# Patient Record
Sex: Female | Born: 1950 | Race: White | Hispanic: No | Marital: Married | Smoking: Former smoker
Health system: Southern US, Community
[De-identification: ages and names within clinical notes are randomized; demographics above are authoritative.]

## PROBLEM LIST (undated history)

## (undated) DIAGNOSIS — R51 Headache: Secondary | ICD-10-CM

## (undated) DIAGNOSIS — F329 Major depressive disorder, single episode, unspecified: Secondary | ICD-10-CM

## (undated) DIAGNOSIS — I209 Angina pectoris, unspecified: Secondary | ICD-10-CM

## (undated) DIAGNOSIS — K219 Gastro-esophageal reflux disease without esophagitis: Secondary | ICD-10-CM

## (undated) DIAGNOSIS — F32A Depression, unspecified: Secondary | ICD-10-CM

## (undated) DIAGNOSIS — I219 Acute myocardial infarction, unspecified: Secondary | ICD-10-CM

## (undated) DIAGNOSIS — I10 Essential (primary) hypertension: Secondary | ICD-10-CM

## (undated) DIAGNOSIS — R7989 Other specified abnormal findings of blood chemistry: Secondary | ICD-10-CM

## (undated) DIAGNOSIS — R519 Headache, unspecified: Secondary | ICD-10-CM

## (undated) DIAGNOSIS — I2510 Atherosclerotic heart disease of native coronary artery without angina pectoris: Secondary | ICD-10-CM

## (undated) HISTORY — PX: CATARACT EXTRACTION W/ INTRAOCULAR LENS  IMPLANT, BILATERAL: SHX1307

## (undated) HISTORY — PX: BILATERAL CARPAL TUNNEL RELEASE: SHX6508

## (undated) HISTORY — PX: TUBAL LIGATION: SHX77

## (undated) HISTORY — PX: TONSILLECTOMY: SUR1361

## (undated) HISTORY — PX: CARDIAC CATHETERIZATION: SHX172

## (undated) HISTORY — PX: CHOLECYSTECTOMY: SHX55

## (undated) HISTORY — PX: ABDOMINAL HYSTERECTOMY: SHX81

---

## 2016-01-08 ENCOUNTER — Other Ambulatory Visit: Payer: Self-pay | Admitting: Neurological Surgery

## 2016-01-28 ENCOUNTER — Encounter (HOSPITAL_COMMUNITY): Payer: Self-pay | Admitting: *Deleted

## 2016-01-28 ENCOUNTER — Encounter (HOSPITAL_COMMUNITY): Payer: Self-pay | Admitting: Vascular Surgery

## 2016-01-28 ENCOUNTER — Encounter (HOSPITAL_COMMUNITY)
Admission: RE | Admit: 2016-01-28 | Discharge: 2016-01-28 | Disposition: A | Discharge status: Home / Self Care | Payer: Medicare Other | Source: Ambulatory Visit | Attending: Neurological Surgery | Admitting: Neurological Surgery

## 2016-01-28 DIAGNOSIS — Z01812 Encounter for preprocedural laboratory examination: Secondary | ICD-10-CM | POA: Diagnosis not present

## 2016-01-28 DIAGNOSIS — I10 Essential (primary) hypertension: Secondary | ICD-10-CM | POA: Diagnosis not present

## 2016-01-28 DIAGNOSIS — Z01810 Encounter for preprocedural cardiovascular examination: Secondary | ICD-10-CM | POA: Diagnosis present

## 2016-01-28 HISTORY — DX: Headache: R51

## 2016-01-28 HISTORY — DX: Other specified abnormal findings of blood chemistry: R79.89

## 2016-01-28 HISTORY — DX: Gastro-esophageal reflux disease without esophagitis: K21.9

## 2016-01-28 HISTORY — DX: Acute myocardial infarction, unspecified: I21.9

## 2016-01-28 HISTORY — DX: Depression, unspecified: F32.A

## 2016-01-28 HISTORY — DX: Headache, unspecified: R51.9

## 2016-01-28 HISTORY — DX: Essential (primary) hypertension: I10

## 2016-01-28 HISTORY — DX: Angina pectoris, unspecified: I20.9

## 2016-01-28 HISTORY — DX: Major depressive disorder, single episode, unspecified: F32.9

## 2016-01-28 LAB — CBC
HCT: 37.4 % (ref 36.0–46.0)
Hemoglobin: 12.1 g/dL (ref 12.0–15.0)
MCH: 27.7 pg (ref 26.0–34.0)
MCHC: 32.4 g/dL (ref 30.0–36.0)
MCV: 85.6 fL (ref 78.0–100.0)
PLATELETS: 192 10*3/uL (ref 150–400)
RBC: 4.37 MIL/uL (ref 3.87–5.11)
RDW: 14.6 % (ref 11.5–15.5)
WBC: 8.1 10*3/uL (ref 4.0–10.5)

## 2016-01-28 LAB — ABO/RH: ABO/RH(D): A POS

## 2016-01-28 LAB — BASIC METABOLIC PANEL
Anion gap: 6 (ref 5–15)
BUN: 10 mg/dL (ref 6–20)
CALCIUM: 9.6 mg/dL (ref 8.9–10.3)
CO2: 25 mmol/L (ref 22–32)
CREATININE: 1.22 mg/dL — AB (ref 0.44–1.00)
Chloride: 109 mmol/L (ref 101–111)
GFR calc Af Amer: 52 mL/min — ABNORMAL LOW (ref >60)
GFR, EST NON AFRICAN AMERICAN: 45 mL/min — AB (ref >60)
GLUCOSE: 96 mg/dL (ref 65–99)
POTASSIUM: 3.9 mmol/L (ref 3.5–5.1)
SODIUM: 140 mmol/L (ref 135–145)

## 2016-01-28 LAB — TYPE AND SCREEN
ABO/RH(D): A POS
ANTIBODY SCREEN: NEGATIVE

## 2016-01-28 LAB — SURGICAL PCR SCREEN
MRSA, PCR: NEGATIVE
Staphylococcus aureus: NEGATIVE

## 2016-01-28 NOTE — Pre-Procedure Instructions (Signed)
Kristina Riley  01/28/2016      [facility] - [place], [place] - [address] [place]ina [place] [address] Phone: [phone] Fax: [phone]    Your procedure is scheduled on  Thursday 02/06/16  [facility] Admitting at 530 A.M.  Call this number if you have problems the morning of surgery:  [phone]   Remember:  Do not eat food or drink liquids after midnight.  Take these medicines the morning of surgery with A SIP OF WATER   ALBUTEROL, BUPROPION (WELLBUTRIN), CARVEDILOL (COREG), CITALOPRAM(CELEXA), GABAPENTIN, ISOSORBIDE MONONITRATE (IMDUR), TOPIRAMATE (TOPAMAX)         (STOP 7 DAYS PRIOR TO SURGERY - ASPIRIN OR ASPIRIN PRODUCTS, IBUPROFEN/ ADVIL/ MOTRIN/ ALEVE, GOODY POWDERS, BC'S, HERBAL MEDICINES, COQ10)   Do not wear jewelry, make-up or nail polish.  Do not wear lotions, powders, or perfumes, or deoderant.  Do not shave 48 hours prior to surgery.  Men may shave face and neck.  Do not bring valuables to the hospital.  [facility] is not responsible for any belongings or valuables.  Contacts, dentures or bridgework may not be worn into surgery.  Leave your suitcase in the car.  After surgery it may be brought to your room.  For patients admitted to the hospital, discharge time will be determined by your treatment team.  Patients discharged the day of surgery will not be allowed to drive home.   Name and phone number of your driver:    Special instructions:  [place] - Preparing for Surgery  Before surgery, you can play an important role.  Because skin is not sterile, your skin needs to be as free of germs as possible.  You can reduce the number of germs on you skin by washing with CHG (chlorahexidine gluconate) soap before surgery.  CHG is an antiseptic cleaner which kills germs and bonds with the skin to continue killing germs even after washing.  Please DO NOT use if you have an allergy to CHG or  antibacterial soaps.  If your skin becomes reddened/irritated stop using the CHG and inform your nurse when you arrive at Short Stay.  Do not shave (including legs and underarms) for at least 48 hours prior to the first CHG shower.  You may shave your face.  Please follow these instructions carefully:   1.  Shower with CHG Soap the night before surgery and the                                morning of Surgery.  2.  If you choose to wash your hair, wash your hair first as usual with your       normal shampoo.  3.  After you shampoo, rinse your hair and body thoroughly to remove the                      Shampoo.  4.  Use CHG as you would any other liquid soap.  You can apply chg directly       to the skin and wash gently with scrungie or a clean washcloth.  5.  Apply the CHG Soap to your body ONLY FROM THE NECK DOWN.        Do not use on open wounds or open sores.  Avoid contact with your eyes,       ears, mouth and genitals (  private parts).  Wash genitals (private parts)       with your normal soap.  6.  Wash thoroughly, paying special attention to the area where your surgery        will be performed.  7.  Thoroughly rinse your body with warm water from the neck down.  8.  DO NOT shower/wash with your normal soap after using and rinsing off       the CHG Soap.  9.  Pat yourself dry with a clean towel.            10.  Wear clean pajamas.            11.  Place clean sheets on your bed the night of your first shower and do not        sleep with pets.  Day of Surgery  Do not apply any lotions/deoderants the morning of surgery.  Please wear clean clothes to the hospital/surgery center.     Please read over the following fact sheets that you were given. MRSA Information and Surgical Site Infection Prevention

## 2016-01-29 ENCOUNTER — Encounter (HOSPITAL_COMMUNITY): Payer: Self-pay | Admitting: Emergency Medicine

## 2016-01-29 NOTE — Progress Notes (Signed)
Anesthesia Chart Review:  Pt is a 65 year old female scheduled for C4-7 ACDF on 02/06/2016 with Cherrie DistanceBenjamin Ditty, MD.   - PCP is Vania ReaAlicia Sigmon, PA (notes in care everywhere) - [facility], last office visit 10/01/15 with Marney SettingWhitney Patterson, NP  PMH includes:  CAD, HTN, GERD. Former smoker. BMI 32  Medications include: albuterol, ASA, lipitor, carvedilol, plavix, dexilant, hctz, imdur. Pt to stop plavix 1 week prior to surgery.   Preoperative labs reviewed.    EKG 01/28/16: Sinus bradycardia (59 bpm). Nonspecific ST abnormality  Nuclear stress test 12/31/15: Technically adequate study. No evidence of significant rest or stress-induced ischemia. Normal systolic LV performance, EF 85%  Cardiac cath 12/12/13 [facility], [place], [place]):  - LAD with distal 50% stenosis.  - CX 30% - Distal R PLV 99% due to thrombus lesion.   - Medical management  If no changes, I anticipate pt can proceed with surgery as scheduled.   Rica Mastngela Samaiyah Howes, FNP-BC [facility] Phone: [phone] 01/29/2016 11:34 AM

## 2016-02-05 NOTE — Anesthesia Preprocedure Evaluation (Addendum)
Anesthesia Evaluation  Patient identified by MRN, date of birth, ID band Patient awake    Reviewed: Allergy & Precautions, H&P , NPO status , Patient's Chart, lab work & pertinent test results  Airway Mallampati: III  TM Distance: >3 FB Neck ROM: Full    Dental no notable dental hx. (+) Teeth Intact, Dental Advisory Given   Pulmonary neg pulmonary ROS, former smoker,    Pulmonary exam normal breath sounds clear to auscultation       Cardiovascular hypertension, Pt. on medications and Pt. on home beta blockers + angina with exertion + CAD   Rhythm:Regular Rate:Normal     Neuro/Psych  Headaches, Depression negative psych ROS   GI/Hepatic Neg liver ROS, GERD  ,  Endo/Other  negative endocrine ROS  Renal/GU negative Renal ROS  negative genitourinary   Musculoskeletal   Abdominal   Peds  Hematology negative hematology ROS (+)   Anesthesia Other Findings   Reproductive/Obstetrics negative OB ROS                           Anesthesia Physical Anesthesia Plan  ASA: III  Anesthesia Plan: General   Post-op Pain Management:    Induction: Intravenous  Airway Management Planned: Oral ETT  Additional Equipment:   Intra-op Plan:   Post-operative Plan: Extubation in OR  Informed Consent: I have reviewed the patients History and Physical, chart, labs and discussed the procedure including the risks, benefits and alternatives for the proposed anesthesia with the patient or authorized representative who has indicated his/her understanding and acceptance.   Dental advisory given  Plan Discussed with: CRNA  Anesthesia Plan Comments:         Anesthesia Quick Evaluation

## 2016-02-06 ENCOUNTER — Encounter (HOSPITAL_COMMUNITY): Payer: Self-pay | Admitting: *Deleted

## 2016-02-06 ENCOUNTER — Encounter (HOSPITAL_COMMUNITY)
Admission: RE | Disposition: A | Discharge status: Home / Self Care | Payer: Self-pay | Source: Ambulatory Visit | Attending: Neurological Surgery

## 2016-02-06 ENCOUNTER — Inpatient Hospital Stay (HOSPITAL_COMMUNITY)
Admission: RE | Admit: 2016-02-06 | Discharge: 2016-02-07 | DRG: 473 | Disposition: A | Discharge status: Home / Self Care | Payer: Medicare Other | Source: Ambulatory Visit | Attending: Neurological Surgery | Admitting: Neurological Surgery

## 2016-02-06 ENCOUNTER — Inpatient Hospital Stay (HOSPITAL_COMMUNITY): Payer: Medicare Other | Admitting: Emergency Medicine

## 2016-02-06 ENCOUNTER — Inpatient Hospital Stay (HOSPITAL_COMMUNITY): Payer: Medicare Other

## 2016-02-06 DIAGNOSIS — Z7982 Long term (current) use of aspirin: Secondary | ICD-10-CM | POA: Diagnosis not present

## 2016-02-06 DIAGNOSIS — I252 Old myocardial infarction: Secondary | ICD-10-CM | POA: Diagnosis not present

## 2016-02-06 DIAGNOSIS — Z9842 Cataract extraction status, left eye: Secondary | ICD-10-CM | POA: Diagnosis not present

## 2016-02-06 DIAGNOSIS — Z87891 Personal history of nicotine dependence: Secondary | ICD-10-CM

## 2016-02-06 DIAGNOSIS — I10 Essential (primary) hypertension: Secondary | ICD-10-CM | POA: Diagnosis present

## 2016-02-06 DIAGNOSIS — Z961 Presence of intraocular lens: Secondary | ICD-10-CM | POA: Diagnosis present

## 2016-02-06 DIAGNOSIS — I2510 Atherosclerotic heart disease of native coronary artery without angina pectoris: Secondary | ICD-10-CM | POA: Diagnosis present

## 2016-02-06 DIAGNOSIS — M4722 Other spondylosis with radiculopathy, cervical region: Principal | ICD-10-CM | POA: Diagnosis present

## 2016-02-06 DIAGNOSIS — Z9841 Cataract extraction status, right eye: Secondary | ICD-10-CM

## 2016-02-06 DIAGNOSIS — F329 Major depressive disorder, single episode, unspecified: Secondary | ICD-10-CM | POA: Diagnosis present

## 2016-02-06 DIAGNOSIS — K219 Gastro-esophageal reflux disease without esophagitis: Secondary | ICD-10-CM | POA: Diagnosis present

## 2016-02-06 DIAGNOSIS — Z79899 Other long term (current) drug therapy: Secondary | ICD-10-CM

## 2016-02-06 HISTORY — DX: Atherosclerotic heart disease of native coronary artery without angina pectoris: I25.10

## 2016-02-06 HISTORY — PX: ANTERIOR CERVICAL DECOMP/DISCECTOMY FUSION: SHX1161

## 2016-02-06 SURGERY — ANTERIOR CERVICAL DECOMPRESSION/DISCECTOMY FUSION 3 LEVELS
Anesthesia: General

## 2016-02-06 MED ORDER — PANTOPRAZOLE SODIUM 40 MG PO TBEC
40.0000 mg | DELAYED_RELEASE_TABLET | Freq: Every day | ORAL | Status: DC
  Administered 2016-02-06 – 2016-02-07 (×2): 40 mg via ORAL
  Filled 2016-02-06 (×2): qty 1

## 2016-02-06 MED ORDER — THROMBIN 5000 UNITS EX SOLR
CUTANEOUS | Status: DC | PRN
  Administered 2016-02-06: 10000 [IU] via TOPICAL

## 2016-02-06 MED ORDER — SODIUM CHLORIDE 0.9% FLUSH: 3.0000 mL | INTRAVENOUS | Status: DC | PRN

## 2016-02-06 MED ORDER — NEOSTIGMINE METHYLSULFATE 10 MG/10ML IV SOLN
INTRAVENOUS | Status: DC | PRN
  Administered 2016-02-06: 3 mg via INTRAVENOUS

## 2016-02-06 MED ORDER — CHLORHEXIDINE GLUCONATE CLOTH 2 % EX PADS: 6.0000 | MEDICATED_PAD | Freq: Once | CUTANEOUS | Status: DC

## 2016-02-06 MED ORDER — SODIUM CHLORIDE 0.9 % IV SOLN: INTRAVENOUS | Status: DC

## 2016-02-06 MED ORDER — TOPIRAMATE 25 MG PO TABS: 50.0000 mg | ORAL_TABLET | Freq: Every day | ORAL | Status: DC

## 2016-02-06 MED ORDER — GELATIN ABSORBABLE MT POWD
OROMUCOSAL | Status: DC | PRN
  Administered 2016-02-06: 09:00:00 via TOPICAL

## 2016-02-06 MED ORDER — SUMATRIPTAN SUCCINATE 50 MG PO TABS: 100.0000 mg | ORAL_TABLET | ORAL | Status: DC | PRN

## 2016-02-06 MED ORDER — 0.9 % SODIUM CHLORIDE (POUR BTL) OPTIME
TOPICAL | Status: DC | PRN
  Administered 2016-02-06: 1000 mL

## 2016-02-06 MED ORDER — CEFUROXIME AXETIL 250 MG PO TABS
250.0000 mg | ORAL_TABLET | Freq: Two times a day (BID) | ORAL | Status: DC
  Administered 2016-02-06 – 2016-02-07 (×2): 250 mg via ORAL
  Filled 2016-02-06 (×3): qty 1

## 2016-02-06 MED ORDER — MIDAZOLAM HCL 2 MG/2ML IJ SOLN
INTRAMUSCULAR | Status: AC
  Filled 2016-02-06: qty 2

## 2016-02-06 MED ORDER — CELECOXIB 200 MG PO CAPS
200.0000 mg | ORAL_CAPSULE | ORAL | Status: AC
  Administered 2016-02-06: 200 mg via ORAL
  Filled 2016-02-06: qty 1

## 2016-02-06 MED ORDER — SODIUM CHLORIDE 0.9 % IV SOLN: 250.0000 mL | INTRAVENOUS | Status: DC

## 2016-02-06 MED ORDER — PANTOPRAZOLE SODIUM 40 MG IV SOLR: 40.0000 mg | Freq: Every day | INTRAVENOUS | Status: DC

## 2016-02-06 MED ORDER — ROCURONIUM BROMIDE 100 MG/10ML IV SOLN
INTRAVENOUS | Status: DC | PRN
  Administered 2016-02-06: 10 mg via INTRAVENOUS
  Administered 2016-02-06: 50 mg via INTRAVENOUS

## 2016-02-06 MED ORDER — DEXAMETHASONE SODIUM PHOSPHATE 10 MG/ML IJ SOLN
INTRAMUSCULAR | Status: DC | PRN
  Administered 2016-02-06: 10 mg via INTRAVENOUS

## 2016-02-06 MED ORDER — PROPOFOL 10 MG/ML IV BOLUS
INTRAVENOUS | Status: AC
  Filled 2016-02-06: qty 20

## 2016-02-06 MED ORDER — FENTANYL CITRATE (PF) 100 MCG/2ML IJ SOLN
INTRAMUSCULAR | Status: AC
  Filled 2016-02-06: qty 4

## 2016-02-06 MED ORDER — ONDANSETRON HCL 4 MG/2ML IJ SOLN
INTRAMUSCULAR | Status: DC | PRN
  Administered 2016-02-06 (×2): 4 mg via INTRAVENOUS

## 2016-02-06 MED ORDER — FENTANYL CITRATE (PF) 100 MCG/2ML IJ SOLN
INTRAMUSCULAR | Status: DC | PRN
  Administered 2016-02-06 (×2): 50 ug via INTRAVENOUS
  Administered 2016-02-06: 100 ug via INTRAVENOUS
  Administered 2016-02-06 (×2): 50 ug via INTRAVENOUS

## 2016-02-06 MED ORDER — POLYETHYL GLYCOL-PROPYL GLYCOL 0.4-0.3 % OP SOLN: 1.0000 [drp] | Freq: Two times a day (BID) | OPHTHALMIC | Status: DC | PRN

## 2016-02-06 MED ORDER — DEXAMETHASONE SODIUM PHOSPHATE 4 MG/ML IJ SOLN
2.0000 mg | Freq: Three times a day (TID) | INTRAMUSCULAR | Status: AC
  Administered 2016-02-06 – 2016-02-07 (×3): 2 mg via INTRAVENOUS
  Filled 2016-02-06 (×3): qty 1

## 2016-02-06 MED ORDER — NITROGLYCERIN 0.4 MG SL SUBL: 0.4000 mg | SUBLINGUAL_TABLET | SUBLINGUAL | Status: DC | PRN

## 2016-02-06 MED ORDER — SODIUM CHLORIDE 0.9% FLUSH
3.0000 mL | Freq: Two times a day (BID) | INTRAVENOUS | Status: DC
  Administered 2016-02-06: 3 mL via INTRAVENOUS

## 2016-02-06 MED ORDER — GABAPENTIN 400 MG PO CAPS: 400.0000 mg | ORAL_CAPSULE | Freq: Three times a day (TID) | ORAL | Status: DC

## 2016-02-06 MED ORDER — BISACODYL 10 MG RE SUPP: 10.0000 mg | Freq: Every day | RECTAL | Status: DC | PRN

## 2016-02-06 MED ORDER — ASPIRIN 81 MG PO CHEW
81.0000 mg | CHEWABLE_TABLET | Freq: Every day | ORAL | Status: DC
  Administered 2016-02-07: 81 mg via ORAL
  Filled 2016-02-06: qty 1

## 2016-02-06 MED ORDER — THROMBIN 5000 UNITS EX SOLR
CUTANEOUS | Status: AC
  Filled 2016-02-06: qty 15000

## 2016-02-06 MED ORDER — GLYCOPYRROLATE 0.2 MG/ML IJ SOLN
INTRAMUSCULAR | Status: DC | PRN
  Administered 2016-02-06: 0.4 mg via INTRAVENOUS

## 2016-02-06 MED ORDER — PROPOFOL 10 MG/ML IV BOLUS
INTRAVENOUS | Status: DC | PRN
  Administered 2016-02-06: 120 mg via INTRAVENOUS

## 2016-02-06 MED ORDER — CEFAZOLIN SODIUM-DEXTROSE 2-4 GM/100ML-% IV SOLN
2.0000 g | INTRAVENOUS | Status: AC
  Administered 2016-02-06: 2 g via INTRAVENOUS
  Filled 2016-02-06: qty 100

## 2016-02-06 MED ORDER — DOCUSATE SODIUM 100 MG PO CAPS
100.0000 mg | ORAL_CAPSULE | Freq: Two times a day (BID) | ORAL | Status: DC
  Administered 2016-02-06 – 2016-02-07 (×2): 100 mg via ORAL
  Filled 2016-02-06 (×2): qty 1

## 2016-02-06 MED ORDER — FLEET ENEMA 7-19 GM/118ML RE ENEM: 1.0000 | ENEMA | Freq: Once | RECTAL | Status: DC | PRN

## 2016-02-06 MED ORDER — CELECOXIB 200 MG PO CAPS
200.0000 mg | ORAL_CAPSULE | Freq: Two times a day (BID) | ORAL | Status: DC
  Administered 2016-02-06 – 2016-02-07 (×2): 200 mg via ORAL
  Filled 2016-02-06 (×2): qty 1

## 2016-02-06 MED ORDER — HYDROCHLOROTHIAZIDE 12.5 MG PO CAPS
12.5000 mg | ORAL_CAPSULE | Freq: Every day | ORAL | Status: DC
  Administered 2016-02-06 – 2016-02-07 (×2): 12.5 mg via ORAL
  Filled 2016-02-06 (×2): qty 1

## 2016-02-06 MED ORDER — MONTELUKAST SODIUM 10 MG PO TABS
10.0000 mg | ORAL_TABLET | Freq: Every day | ORAL | Status: DC
  Administered 2016-02-06: 10 mg via ORAL
  Filled 2016-02-06: qty 1

## 2016-02-06 MED ORDER — CEFAZOLIN IN D5W 1 GM/50ML IV SOLN
1.0000 g | Freq: Three times a day (TID) | INTRAVENOUS | Status: AC
  Administered 2016-02-06 (×2): 1 g via INTRAVENOUS
  Filled 2016-02-06 (×2): qty 50

## 2016-02-06 MED ORDER — ONDANSETRON HCL 4 MG/2ML IJ SOLN: 4.0000 mg | INTRAMUSCULAR | Status: DC | PRN

## 2016-02-06 MED ORDER — BUPROPION HCL ER (XL) 300 MG PO TB24
300.0000 mg | ORAL_TABLET | Freq: Every day | ORAL | Status: DC
  Administered 2016-02-07: 300 mg via ORAL
  Filled 2016-02-06: qty 1

## 2016-02-06 MED ORDER — LIDOCAINE HCL (CARDIAC) 20 MG/ML IV SOLN
INTRAVENOUS | Status: DC | PRN
  Administered 2016-02-06: 60 mg via INTRAVENOUS

## 2016-02-06 MED ORDER — TOPIRAMATE 100 MG PO TABS
100.0000 mg | ORAL_TABLET | Freq: Every day | ORAL | Status: DC
  Administered 2016-02-06: 100 mg via ORAL
  Filled 2016-02-06: qty 1

## 2016-02-06 MED ORDER — LORATADINE 10 MG PO TABS
10.0000 mg | ORAL_TABLET | Freq: Every day | ORAL | Status: DC
  Administered 2016-02-07: 10 mg via ORAL
  Filled 2016-02-06: qty 1

## 2016-02-06 MED ORDER — EPINEPHRINE 0.3 MG/0.3ML IJ SOAJ
0.3000 mg | Freq: Every day | INTRAMUSCULAR | Status: DC | PRN
  Filled 2016-02-06: qty 0.3

## 2016-02-06 MED ORDER — OXYCODONE HCL 5 MG PO TABS: 5.0000 mg | ORAL_TABLET | ORAL | Status: DC | PRN

## 2016-02-06 MED ORDER — TIZANIDINE HCL 4 MG PO TABS
4.0000 mg | ORAL_TABLET | Freq: Two times a day (BID) | ORAL | Status: DC
  Administered 2016-02-06 – 2016-02-07 (×2): 4 mg via ORAL
  Filled 2016-02-06 (×2): qty 1

## 2016-02-06 MED ORDER — OXYCODONE HCL ER 20 MG PO T12A
20.0000 mg | EXTENDED_RELEASE_TABLET | Freq: Two times a day (BID) | ORAL | Status: DC
  Administered 2016-02-06: 20 mg via ORAL
  Filled 2016-02-06: qty 1

## 2016-02-06 MED ORDER — BUPIVACAINE HCL 0.5 % IJ SOLN
INTRAMUSCULAR | Status: DC | PRN
  Administered 2016-02-06: 5 mL

## 2016-02-06 MED ORDER — ISOSORBIDE MONONITRATE ER 60 MG PO TB24
60.0000 mg | ORAL_TABLET | Freq: Every day | ORAL | Status: DC
  Administered 2016-02-07: 60 mg via ORAL
  Filled 2016-02-06: qty 1

## 2016-02-06 MED ORDER — HEMOSTATIC AGENTS (NO CHARGE) OPTIME
TOPICAL | Status: DC | PRN
  Administered 2016-02-06: 1 via TOPICAL

## 2016-02-06 MED ORDER — PHENYLEPHRINE HCL 10 MG/ML IJ SOLN
INTRAMUSCULAR | Status: DC | PRN
  Administered 2016-02-06: 10 ug/min via INTRAVENOUS

## 2016-02-06 MED ORDER — CARVEDILOL 6.25 MG PO TABS
6.2500 mg | ORAL_TABLET | Freq: Two times a day (BID) | ORAL | Status: DC
  Administered 2016-02-06 – 2016-02-07 (×2): 6.25 mg via ORAL
  Filled 2016-02-06 (×2): qty 1

## 2016-02-06 MED ORDER — GABAPENTIN 300 MG PO CAPS
300.0000 mg | ORAL_CAPSULE | Freq: Three times a day (TID) | ORAL | Status: DC
  Administered 2016-02-06: 300 mg via ORAL
  Filled 2016-02-06 (×2): qty 1

## 2016-02-06 MED ORDER — SODIUM CHLORIDE 0.9 % IR SOLN
Status: DC | PRN
  Administered 2016-02-06: 09:00:00

## 2016-02-06 MED ORDER — MENTHOL 3 MG MT LOZG: 1.0000 | LOZENGE | OROMUCOSAL | Status: DC | PRN

## 2016-02-06 MED ORDER — LIDOCAINE-EPINEPHRINE (PF) 2 %-1:200000 IJ SOLN
INTRAMUSCULAR | Status: AC
  Filled 2016-02-06: qty 20

## 2016-02-06 MED ORDER — VITAMIN D3 25 MCG (1000 UNIT) PO TABS
2000.0000 [IU] | ORAL_TABLET | Freq: Every day | ORAL | Status: DC
  Administered 2016-02-07: 2000 [IU] via ORAL
  Filled 2016-02-06 (×2): qty 2

## 2016-02-06 MED ORDER — HYDROMORPHONE HCL 1 MG/ML IJ SOLN: 0.2500 mg | INTRAMUSCULAR | Status: DC | PRN

## 2016-02-06 MED ORDER — INSULIN ASPART 100 UNIT/ML ~~LOC~~ SOLN: 0.0000 [IU] | Freq: Three times a day (TID) | SUBCUTANEOUS | Status: DC

## 2016-02-06 MED ORDER — DIPHENHYDRAMINE HCL 25 MG PO CAPS
25.0000 mg | ORAL_CAPSULE | Freq: Four times a day (QID) | ORAL | Status: DC | PRN
  Administered 2016-02-06: 25 mg via ORAL
  Filled 2016-02-06: qty 1

## 2016-02-06 MED ORDER — METHOCARBAMOL 500 MG PO TABS
ORAL_TABLET | ORAL | Status: AC
  Filled 2016-02-06: qty 2

## 2016-02-06 MED ORDER — EPHEDRINE SULFATE 50 MG/ML IJ SOLN
INTRAMUSCULAR | Status: DC | PRN
  Administered 2016-02-06: 5 mg via INTRAVENOUS
  Administered 2016-02-06: 10 mg via INTRAVENOUS

## 2016-02-06 MED ORDER — PHENOL 1.4 % MT LIQD: 1.0000 | OROMUCOSAL | Status: DC | PRN

## 2016-02-06 MED ORDER — TOPIRAMATE 25 MG PO TABS
50.0000 mg | ORAL_TABLET | Freq: Every day | ORAL | Status: DC
  Administered 2016-02-07: 50 mg via ORAL
  Filled 2016-02-06: qty 2

## 2016-02-06 MED ORDER — ACETAMINOPHEN 500 MG PO TABS
1000.0000 mg | ORAL_TABLET | Freq: Four times a day (QID) | ORAL | Status: DC
  Administered 2016-02-06 – 2016-02-07 (×4): 1000 mg via ORAL
  Filled 2016-02-06 (×4): qty 2

## 2016-02-06 MED ORDER — FENTANYL CITRATE (PF) 100 MCG/2ML IJ SOLN
INTRAMUSCULAR | Status: AC
  Filled 2016-02-06: qty 2

## 2016-02-06 MED ORDER — FLUTICASONE PROPIONATE 50 MCG/ACT NA SUSP: 2.0000 | Freq: Every day | NASAL | Status: DC | PRN

## 2016-02-06 MED ORDER — ZOLPIDEM TARTRATE 5 MG PO TABS: 5.0000 mg | ORAL_TABLET | Freq: Every evening | ORAL | Status: DC | PRN

## 2016-02-06 MED ORDER — GABAPENTIN 300 MG PO CAPS: 300.0000 mg | ORAL_CAPSULE | Freq: Three times a day (TID) | ORAL | Status: DC

## 2016-02-06 MED ORDER — LACTATED RINGERS IV SOLN
INTRAVENOUS | Status: DC | PRN
  Administered 2016-02-06 (×2): via INTRAVENOUS

## 2016-02-06 MED ORDER — LIDOCAINE-EPINEPHRINE 2 %-1:100000 IJ SOLN
INTRAMUSCULAR | Status: DC | PRN
  Administered 2016-02-06: 5 mL via INTRADERMAL

## 2016-02-06 MED ORDER — CITALOPRAM HYDROBROMIDE 40 MG PO TABS
40.0000 mg | ORAL_TABLET | Freq: Every day | ORAL | Status: DC
  Administered 2016-02-07: 40 mg via ORAL
  Filled 2016-02-06: qty 1

## 2016-02-06 MED ORDER — ALBUMIN HUMAN 5 % IV SOLN
INTRAVENOUS | Status: DC | PRN
  Administered 2016-02-06: 10:00:00 via INTRAVENOUS

## 2016-02-06 MED ORDER — METHOCARBAMOL 750 MG PO TABS
750.0000 mg | ORAL_TABLET | Freq: Four times a day (QID) | ORAL | Status: DC
  Administered 2016-02-06 – 2016-02-07 (×4): 750 mg via ORAL
  Filled 2016-02-06 (×3): qty 1

## 2016-02-06 MED ORDER — ATORVASTATIN CALCIUM 20 MG PO TABS
40.0000 mg | ORAL_TABLET | Freq: Every day | ORAL | Status: DC
  Administered 2016-02-06: 40 mg via ORAL
  Filled 2016-02-06: qty 2

## 2016-02-06 MED ORDER — MIDAZOLAM HCL 5 MG/5ML IJ SOLN
INTRAMUSCULAR | Status: DC | PRN
  Administered 2016-02-06: 2 mg via INTRAVENOUS

## 2016-02-06 MED ORDER — SENNA 8.6 MG PO TABS
1.0000 | ORAL_TABLET | Freq: Two times a day (BID) | ORAL | Status: DC
  Administered 2016-02-06 – 2016-02-07 (×2): 8.6 mg via ORAL
  Filled 2016-02-06 (×2): qty 1

## 2016-02-06 MED ORDER — BIOTIN 1000 MCG PO TABS: 1000.0000 ug | ORAL_TABLET | Freq: Every day | ORAL | Status: DC

## 2016-02-06 MED ORDER — COQ-10 100 MG PO CAPS: 100.0000 mg | ORAL_CAPSULE | Freq: Every day | ORAL | Status: DC

## 2016-02-06 MED ORDER — EPINEPHRINE PF 1 MG/ML IJ SOLN
INTRAMUSCULAR | Status: AC
  Filled 2016-02-06: qty 1

## 2016-02-06 MED ORDER — ALBUTEROL SULFATE HFA 108 (90 BASE) MCG/ACT IN AERS: 1.0000 | INHALATION_SPRAY | Freq: Four times a day (QID) | RESPIRATORY_TRACT | Status: DC | PRN

## 2016-02-06 SURGICAL SUPPLY — 80 items
BENZOIN TINCTURE PRP APPL 2/3 (GAUZE/BANDAGES/DRESSINGS) ×3 IMPLANT
BIT DRILL NEURO 2X3.1 SFT TUCH (MISCELLANEOUS) ×1 IMPLANT
BIT DRILL POWER (BIT) ×1 IMPLANT
BLADE SURG 11 STRL SS (BLADE) ×3 IMPLANT
BLADE ULTRA TIP 2M (BLADE) IMPLANT
BONE MATRIX OSTEOCEL PRO SM (Bone Implant) ×6 IMPLANT
BUR MATCHSTICK NEURO 3.0 LAGG (BURR) ×3 IMPLANT
CAGE CERVICAL 14X16X6 7D (Cage) ×4 IMPLANT
CAGE CERVICAL 14X16X6MM 7DEG (Cage) ×2 IMPLANT
CAGE CERVICAL 14X16X7 7D (Cage) ×2 IMPLANT
CAGE CERVICAL 14X16X7MM 7DEG (Cage) ×1 IMPLANT
CANISTER SUCT 3000ML PPV (MISCELLANEOUS) ×3 IMPLANT
CARTRIDGE OIL MAESTRO DRILL (MISCELLANEOUS) ×1 IMPLANT
CHLORAPREP W/TINT 26ML (MISCELLANEOUS) ×3 IMPLANT
CLIP TI MEDIUM 6 (CLIP) ×3 IMPLANT
CLOSURE STERI-STRIP 1/2X4 (GAUZE/BANDAGES/DRESSINGS) ×1
CLSR STERI-STRIP ANTIMIC 1/2X4 (GAUZE/BANDAGES/DRESSINGS) ×2 IMPLANT
DECANTER SPIKE VIAL GLASS SM (MISCELLANEOUS) IMPLANT
DERMABOND ADVANCED (GAUZE/BANDAGES/DRESSINGS) ×2
DERMABOND ADVANCED .7 DNX12 (GAUZE/BANDAGES/DRESSINGS) ×1 IMPLANT
DIFFUSER DRILL AIR PNEUMATIC (MISCELLANEOUS) ×3 IMPLANT
DRAIN SNY WOU 7FLT (WOUND CARE) ×3 IMPLANT
DRAPE C-ARM 42X72 X-RAY (DRAPES) ×6 IMPLANT
DRAPE HALF SHEET 40X57 (DRAPES) ×3 IMPLANT
DRAPE LAPAROTOMY 100X72 PEDS (DRAPES) ×3 IMPLANT
DRAPE MICROSCOPE LEICA (MISCELLANEOUS) ×3 IMPLANT
DRAPE POUCH INSTRU U-SHP 10X18 (DRAPES) ×3 IMPLANT
DRAPE SHEET LG 3/4 BI-LAMINATE (DRAPES) ×3 IMPLANT
DRESSING OPSITE X SMALL 2X3 (GAUZE/BANDAGES/DRESSINGS) ×3 IMPLANT
DRILL BIT POWER (BIT) ×2
DRILL NEURO 2X3.1 SOFT TOUCH (MISCELLANEOUS) ×3
DRSG OPSITE POSTOP 4X6 (GAUZE/BANDAGES/DRESSINGS) ×3 IMPLANT
ELECT COATED BLADE 2.86 ST (ELECTRODE) ×3 IMPLANT
ELECT REM PT RETURN 9FT ADLT (ELECTROSURGICAL) ×3
ELECTRODE REM PT RTRN 9FT ADLT (ELECTROSURGICAL) ×1 IMPLANT
EVACUATOR 1/8 PVC DRAIN (DRAIN) IMPLANT
EVACUATOR SILICONE 100CC (DRAIN) ×3 IMPLANT
GAUZE SPONGE 4X4 12PLY STRL (GAUZE/BANDAGES/DRESSINGS) IMPLANT
GAUZE SPONGE 4X4 16PLY XRAY LF (GAUZE/BANDAGES/DRESSINGS) IMPLANT
GLOVE BIOGEL PI IND STRL 7.5 (GLOVE) ×2 IMPLANT
GLOVE BIOGEL PI INDICATOR 7.5 (GLOVE) ×4
GLOVE ECLIPSE 7.0 STRL STRAW (GLOVE) ×3 IMPLANT
GLOVE EXAM NITRILE LRG STRL (GLOVE) IMPLANT
GLOVE SS BIOGEL STRL SZ 7.5 (GLOVE) ×3 IMPLANT
GLOVE SUPERSENSE BIOGEL SZ 7.5 (GLOVE) ×6
GOWN STRL REUS W/ TWL LRG LVL3 (GOWN DISPOSABLE) ×3 IMPLANT
GOWN STRL REUS W/ TWL XL LVL3 (GOWN DISPOSABLE) IMPLANT
GOWN STRL REUS W/TWL LRG LVL3 (GOWN DISPOSABLE) ×6
GOWN STRL REUS W/TWL XL LVL3 (GOWN DISPOSABLE)
HEMOSTAT POWDER KIT SURGIFOAM (HEMOSTASIS) ×3 IMPLANT
KIT BASIN OR (CUSTOM PROCEDURE TRAY) ×3 IMPLANT
KIT ROOM TURNOVER OR (KITS) ×3 IMPLANT
NEEDLE HYPO 21X1.5 SAFETY (NEEDLE) ×3 IMPLANT
NEEDLE SPNL 22GX3.5 QUINCKE BK (NEEDLE) ×3 IMPLANT
NS IRRIG 1000ML POUR BTL (IV SOLUTION) ×3 IMPLANT
OIL CARTRIDGE MAESTRO DRILL (MISCELLANEOUS) ×3
PACK LAMINECTOMY NEURO (CUSTOM PROCEDURE TRAY) ×3 IMPLANT
PAD ARMBOARD 7.5X6 YLW CONV (MISCELLANEOUS) ×6 IMPLANT
PATTIES SURGICAL .5X1.5 (GAUZE/BANDAGES/DRESSINGS) IMPLANT
PLATE ARCHON 3-LEVEL 54MM (Plate) ×3 IMPLANT
RUBBERBAND STERILE (MISCELLANEOUS) ×6 IMPLANT
SCREW ARCHON ST FIXED 4X17 (Screw) ×6 IMPLANT
SCREW ARCHON ST VAR 4.0X17MM (Screw) ×18 IMPLANT
SPONGE GAUZE 4X4 12PLY STER LF (GAUZE/BANDAGES/DRESSINGS) ×3 IMPLANT
SPONGE INTESTINAL PEANUT (DISPOSABLE) ×6 IMPLANT
SPONGE SURGIFOAM ABS GEL SZ50 (HEMOSTASIS) ×3 IMPLANT
STAPLER VISISTAT 35W (STAPLE) ×3 IMPLANT
STOCKINETTE 6  STRL (DRAPES) ×2
STOCKINETTE 6 STRL (DRAPES) ×1 IMPLANT
SUT SILK 3 0 SH 30 (SUTURE) ×3 IMPLANT
SUT SILK 3 0 TIES 10X30 (SUTURE) ×3 IMPLANT
SUT STRATAFIX MNCRL+ 3-0 PS-2 (SUTURE) ×1
SUT STRATAFIX MONOCRYL 3-0 (SUTURE) ×2
SUT VIC AB 3-0 SH 8-18 (SUTURE) ×3 IMPLANT
SUTURE STRATFX MNCRL+ 3-0 PS-2 (SUTURE) ×1 IMPLANT
TOWEL OR 17X24 6PK STRL BLUE (TOWEL DISPOSABLE) ×6 IMPLANT
TOWEL OR 17X26 10 PK STRL BLUE (TOWEL DISPOSABLE) ×3 IMPLANT
TUBE CONNECTING 12'X1/4 (SUCTIONS)
TUBE CONNECTING 12X1/4 (SUCTIONS) IMPLANT
WATER STERILE IRR 1000ML POUR (IV SOLUTION) ×3 IMPLANT

## 2016-02-06 NOTE — Transfer of Care (Signed)
Immediate Anesthesia Transfer of Care Note  Patient: Kristina Riley  Procedure(s) Performed: Procedure(s) with comments: CERVICAL FOUR  TO CEVERICAL SEVEN ANTERIOR CERVICAL DECOMPRESSION/DISCECTOMY/FUSION (N/A) - CERVICAL FOUR  TO CEVERICAL SEVEN ANTERIOR CERVICAL DECOMPRESSION/DISCECTOMY/FUSION  Patient Location: PACU  Anesthesia Type:General  Level of Consciousness: awake, alert , oriented and patient cooperative  Airway & Oxygen Therapy: Patient Spontanous Breathing and Patient connected to nasal cannula oxygen  Post-op Assessment: Report given to RN and Post -op Vital signs reviewed and stable  Post vital signs: Reviewed and stable  Last Vitals:  Vitals:   02/06/16 0621  BP: 125/68  Pulse: 64  Resp: 16  Temp: 36.7 C    Last Pain:  Vitals:   02/06/16 0621  TempSrc: Oral      Patients Stated Pain Goal: 3 (02/06/16 0617)  Complications: No apparent anesthesia complications

## 2016-02-06 NOTE — Op Note (Signed)
02/06/2016  10:56 AM  PATIENT:  Kristina Riley  65 y.o. female  PRE-OPERATIVE DIAGNOSIS:  Cervical spondylosis with radiculopathy  POST-OPERATIVE DIAGNOSIS:  Same  PROCEDURE:  C4-7 anterior discectomy with fusion and plate fixation; use of PEEK interbody spacers  SURGEON:  Hulan SaasBenjamin J. Angeligue Bowne, MD  ASSISTANTS: Lisbeth RenshawNeelesh Nundkumar, MD  ANESTHESIA:   General  DRAINS: JP   SPECIMEN:  None  INDICATION FOR PROCEDURE: 65 year old woman with cervical spondylosis with radiculopathy which did not improve with medical management. Patient understood the risks, benefits, and alternatives and potential outcomes and wished to proceed.  PROCEDURE DETAILS: Patient was brought to the operating room placed under general endotracheal anesthesia. Patient was placed in the supine position on the operating room table. The neck was prepped with betadine and chloraprep and draped in a sterile fashion.   A transverse incision was made on the right side of the neck. Dissection was carried down thru the subcutaneous tissue and the platysma was elevated, opened vertically, and undermined with Metzenbaum scissors. Dissection was then carried out thru an avascular plane leaving the sternocleidomastoid, carotid artery, and jugular vein laterally and the trachea and esophagus medially. The ventral aspect of the vertebral column was identified and a localizing x-ray was taken. The C4-5 level was identified. The longus colli muscles were then elevated and the retractor was placed. The annulus was incised and the disc space entered. Discectomy was performed with micro-curettes and pituitary and kerrison rongeurs. I then used the high-speed drill to drill the endplates down to the level of the posterior longitudinal ligament. The operating microscope was draped and brought into the field provided additional magnification, illumination and visualization. Utilizing microsurgical technique, discectomy was continued posteriorly  thru the disc space. Posterior longitudinal ligament was opened with a nerve hook, and then removed along with disc herniation and osteophytes, decompressing the spinal canal and thecal sac. We then continued to remove osteophytic overgrowth and disc material decompressing the neural foramina and exiting nerve roots bilaterally. So by both visualization and palpation we felt we had an adequate decompression of the neural elements. We then measured the height of the intravertebral disc space and selected a Peek interbody cage packed with morselized allograft. It was then gently positioned in the intravertebral disc space and countersunk.   The superior distraction pin was then removed and bone bleeding was stopped with bone wax. The distraction pin was inserted at the next inferior level. The annulus was incised and the disc space entered. Discectomy was performed with micro-curettes and pituitary and kerrison rongeurs. I then used the high-speed drill to drill the endplates down to the level of the posterior longitudinal ligament. The operating microscope was returned to the field.  The discectomy was continued posteriorly thru the disc space. Posterior longitudinal ligament was opened with a nerve hook, and then removed along with disc herniation and osteophytes, decompressing the spinal canal and thecal sac. We then continued to remove osteophytic overgrowth and disc material decompressing the neural foramina and exiting nerve roots bilaterally. So by both visualization and palpation we felt we had an adequate decompression of the neural elements. We then measured the height of the intravertebral disc space and selected a second Peek interbody cage which was packed with morselized allograft. It was then gently positioned in the intravertebral disc space and countersunk.   The superior distraction pin was then again removed and bone bleeding was stopped with bone wax. The distraction pin was inserted at the  most inferior level.  The annulus was incised and the disc space entered. Discectomy was performed with micro-curettes and pituitary and kerrison rongeurs. I then used the high-speed drill to drill the endplates down to the level of the posterior longitudinal ligament. The operating microscope was returned to the field.  The discectomy was continued posteriorly thru the disc space. Posterior longitudinal ligament was opened with a nerve hook, and then removed along with disc herniation and osteophytes, decompressing the spinal canal and thecal sac. We then continued to remove osteophytic overgrowth and disc material decompressing the neural foramina and exiting nerve roots bilaterally. So by both visualization and palpation we felt we had an adequate decompression of the neural elements. We then measured the height of the intravertebral disc space and selected a third Peek interbody cage which was packed with morselized allograft. It was then gently positioned in the intravertebral disc space and countersunk.   I then inserted a titanium plate and placed six variable angle screws into the three superior vertebral bodies and two fixed angle screws at the inferior level and locked them into position. The wound was irrigated with bacitracin solution, checked for hemostasis which was established and confirmed. Once meticulous hemostasis was achieved a medium hemovac drain was placed. The platysma was closed with interrupted 3-0 undyed Vicryl suture, the subcuticular layer was closed with running monocryl suture. The skin edges were approximated with dermabond. The drapes were removed. A sterile dressing was applied. The patient was then awakened from general anesthesia and transferred to the recovery room in stable condition. At the end of the procedure all sponge, needle and instrument counts were correct.   PATIENT DISPOSITION:  PACU - hemodynamically stable.   Delay start of Pharmacological VTE agent (>24hrs)  due to surgical blood loss or risk of bleeding:  yes

## 2016-02-06 NOTE — Progress Notes (Signed)
No acute post op issues Awake and alert Voice normal Antigravity with upper and lower extremities Neck looks good Stable

## 2016-02-06 NOTE — H&P (Signed)
CC:  No chief complaint on file.   HPI: Kristina Riley is a 65 y.o. female with cervical radiculopathy presents for a C4-7 ACDF.  No changes since clinic.  PMH: Past Medical History:  Diagnosis Date  . Anginal pain (HCC)   . Coronary artery disease   . Creatinine elevation   . Depression   . GERD (gastroesophageal reflux disease)   . Headache   . Hypertension   . Myocardial infarction     PSH: Past Surgical History:  Procedure Laterality Date  . ABDOMINAL HYSTERECTOMY     PARTIAL   . BILATERAL CARPAL TUNNEL RELEASE    . CARDIAC CATHETERIZATION     12/2013  . CATARACT EXTRACTION W/ INTRAOCULAR LENS  IMPLANT, BILATERAL    . CHOLECYSTECTOMY    . TONSILLECTOMY    . TUBAL LIGATION      SH: Social History  Substance Use Topics  . Smoking status: Former Games developermoker  . Smokeless tobacco: Never Used  . Alcohol use No    MEDS: Prior to Admission medications   Medication Sig Start Date End Date Taking? Authorizing Provider  aspirin 81 MG chewable tablet Chew 81 mg by mouth daily.   Yes Historical Provider, MD  atorvastatin (LIPITOR) 40 MG tablet Take 40 mg by mouth at bedtime. 12/02/15  Yes Historical Provider, MD  Biotin 1000 MCG tablet Take 1,000 mcg by mouth at bedtime.   Yes Historical Provider, MD  buPROPion (WELLBUTRIN XL) 300 MG 24 hr tablet Take 300 mg by mouth daily. 11/04/15  Yes Historical Provider, MD  carvedilol (COREG) 6.25 MG tablet Take 6.25 mg by mouth 2 (two) times daily. 12/25/15  Yes Historical Provider, MD  cefUROXime (CEFTIN) 250 MG tablet Take 250 mg by mouth 2 (two) times daily with a meal. 01/29/16 02/07/16 Yes Historical Provider, MD  Cholecalciferol (VITAMIN D3) 2000 units capsule Take 2,000 Units by mouth daily.   Yes Historical Provider, MD  citalopram (CELEXA) 40 MG tablet Take 40 mg by mouth daily. 11/18/15  Yes Historical Provider, MD  clopidogrel (PLAVIX) 75 MG tablet Take 75 mg by mouth at bedtime. 12/17/15  Yes Historical Provider, MD  Coenzyme Q10  (COQ-10) 100 MG CAPS Take 100 mg by mouth daily.   Yes Historical Provider, MD  DEXILANT 60 MG capsule Take 60 mg by mouth at bedtime. 12/02/15  Yes Historical Provider, MD  fexofenadine (ALLEGRA) 180 MG tablet Take 180 mg by mouth daily.   Yes Historical Provider, MD  gabapentin (NEURONTIN) 300 MG capsule Take 300 mg by mouth 2 (two) times daily. 12/12/15  Yes Historical Provider, MD  hydrochlorothiazide (MICROZIDE) 12.5 MG capsule Take 12.5 mg by mouth daily. 12/26/15  Yes Historical Provider, MD  isosorbide mononitrate (IMDUR) 60 MG 24 hr tablet Take 60 mg by mouth daily. 10/29/15  Yes Historical Provider, MD  montelukast (SINGULAIR) 10 MG tablet Take 10 mg by mouth at bedtime. 11/03/15  Yes Historical Provider, MD  Polyethyl Glycol-Propyl Glycol (SYSTANE ULTRA) 0.4-0.3 % SOLN Place 1-2 drops into both eyes 2 (two) times daily as needed (for dry eyes). Support Advanced Lubricant Eye Drops   Yes Historical Provider, MD  rizatriptan (MAXALT) 10 MG tablet Take 10 mg by mouth daily as needed for migraine. May repeat in 2 hours if needed   Yes Historical Provider, MD  tiZANidine (ZANAFLEX) 4 MG tablet Take 4 mg by mouth 2 (two) times daily.   Yes Historical Provider, MD  topiramate (TOPAMAX) 50 MG tablet Take 50-100 mg by mouth  2 (two) times daily. 50 mg in the morning and 100 mg at night   Yes Historical Provider, MD  albuterol (PROVENTIL HFA;VENTOLIN HFA) 108 (90 Base) MCG/ACT inhaler Inhale 1-2 puffs into the lungs every 6 (six) hours as needed for wheezing or shortness of breath.    Historical Provider, MD  EPINEPHrine 0.3 mg/0.3 mL IJ SOAJ injection Inject 0.3 mg into the muscle daily as needed (allergic reaction.).    Historical Provider, MD  fluticasone (FLONASE) 50 MCG/ACT nasal spray Place 2 sprays into both nostrils daily as needed (for allergies/nasal symptoms).    Historical Provider, MD  nitroGLYCERIN (NITROSTAT) 0.4 MG SL tablet Place 0.4 mg under the tongue every 5 (five) minutes as needed for  chest pain.    Historical Provider, MD    ALLERGY: Allergies  Allergen Reactions  . Food Other (See Comments)    Seafood--"swelling" and numbness of the lips  . Gabapentin Other (See Comments)    PATIENT STATES IT MADE HER INCONTINENT OF URINE  . Percocet [Oxycodone-Acetaminophen] Itching    ROS: ROS  NEUROLOGIC EXAM: Awake, alert, oriented Memory and concentration grossly intact Speech fluent, appropriate CN grossly intact Motor exam: Upper Extremities Deltoid Bicep Tricep Grip  Right 5/5 5/5 5/5 5/5  Left 5/5 5/5 5/5 5/5   Lower Extremity IP Quad PF DF EHL  Right 5/5 5/5 5/5 5/5 5/5  Left 5/5 5/5 5/5 5/5 5/5   Sensation grossly intact to LT  IMAGING: No new imaging  IMPRESSION: - 65 y.o. female with cervical radiculopathy.  PLAN: - C4-7 ACDF - Risks, benefits, and alternatives discussed.

## 2016-02-06 NOTE — Progress Notes (Signed)
Orthopedic Tech Progress Note Patient Details:  Kristina Riley Sep 13, 1950 [id number]  Ortho Devices Type of Ortho Device: Soft collar Ortho Device/Splint Location: Soft Collar (neck) Ortho Device/Splint Interventions: Application   Kristina Riley, Kristina Riley 02/06/2016, 11:55 AM

## 2016-02-06 NOTE — Anesthesia Postprocedure Evaluation (Signed)
Anesthesia Post Note  Patient: Tyshawn J Theilen  Procedure(s) Performed: Procedure(s) (LRB): CERVICAL FOUR  TO CEVERICAL SEVEN ANTERIOR CERVICAL DECOMPRESSION/DISCECTOMY/FUSION (N/A)  Patient location during evaluation: PACU Anesthesia Type: General Level of consciousness: awake and alert Pain management: pain level controlled Vital Signs Assessment: post-procedure vital signs reviewed and stable Respiratory status: spontaneous breathing, nonlabored ventilation, respiratory function stable and patient connected to nasal cannula oxygen Cardiovascular status: blood pressure returned to baseline and stable Postop Assessment: no signs of nausea or vomiting Anesthetic complications: no    Last Vitals:  Vitals:   02/06/16 1145 02/06/16 1200  BP: 114/71 110/75  Pulse: 79 74  Resp: 15 10  Temp:      Last Pain:  Vitals:   02/06/16 1145  TempSrc:   PainSc: 3                  Ishaaq Penna,W. EDMOND

## 2016-02-07 MED ORDER — CLOPIDOGREL BISULFATE 75 MG PO TABS: 75.0000 mg | ORAL_TABLET | Freq: Every day | ORAL | 0 refills | Status: AC

## 2016-02-07 MED ORDER — GABAPENTIN 300 MG PO CAPS: 300.0000 mg | ORAL_CAPSULE | Freq: Three times a day (TID) | ORAL | 2 refills | Status: AC

## 2016-02-07 MED ORDER — METHOCARBAMOL 750 MG PO TABS: 750.0000 mg | ORAL_TABLET | Freq: Four times a day (QID) | ORAL | 2 refills | Status: AC

## 2016-02-07 MED ORDER — HYDROCODONE-ACETAMINOPHEN 7.5-325 MG PO TABS: 1.0000 | ORAL_TABLET | ORAL | 0 refills | Status: AC | PRN

## 2016-02-07 NOTE — Discharge Summary (Signed)
Date of Admission: 02/06/2016  Date of Discharge: 02/07/16  Admission Diagnosis: Cervical spondylosis with radiculopathy  Discharge Diagnosis: Same  Procedure Performed: C4-7 ACDF  Attending: Loura HaltBenjamin Jared Chari Parmenter, MD  Hospital Course:  The patient was admitted for the above listed operation and had an uncomplicated post-operative course.  They were discharged in stable condition.  Follow up: 3 weeks    Medication List    TAKE these medications   albuterol 108 (90 Base) MCG/ACT inhaler Commonly known as:  PROVENTIL HFA;VENTOLIN HFA Inhale 1-2 puffs into the lungs every 6 (six) hours as needed for wheezing or shortness of breath.   aspirin 81 MG chewable tablet Chew 81 mg by mouth daily.   atorvastatin 40 MG tablet Commonly known as:  LIPITOR Take 40 mg by mouth at bedtime.   Biotin 1000 MCG tablet Take 1,000 mcg by mouth at bedtime.   buPROPion 300 MG 24 hr tablet Commonly known as:  WELLBUTRIN XL Take 300 mg by mouth daily.   carvedilol 6.25 MG tablet Commonly known as:  COREG Take 6.25 mg by mouth 2 (two) times daily.   cefUROXime 250 MG tablet Commonly known as:  CEFTIN Take 250 mg by mouth 2 (two) times daily with a meal.   citalopram 40 MG tablet Commonly known as:  CELEXA Take 40 mg by mouth daily.   clopidogrel 75 MG tablet Commonly known as:  PLAVIX Take 1 tablet (75 mg total) by mouth at bedtime. Start taking on:  02/13/2016   CoQ-10 100 MG Caps Take 100 mg by mouth daily.   DEXILANT 60 MG capsule Generic drug:  dexlansoprazole Take 60 mg by mouth at bedtime.   EPINEPHrine 0.3 mg/0.3 mL Soaj injection Commonly known as:  EPI-PEN Inject 0.3 mg into the muscle daily as needed (allergic reaction.).   fexofenadine 180 MG tablet Commonly known as:  ALLEGRA Take 180 mg by mouth daily.   fluticasone 50 MCG/ACT nasal spray Commonly known as:  FLONASE Place 2 sprays into both nostrils daily as needed (for allergies/nasal symptoms).   gabapentin  300 MG capsule Commonly known as:  NEURONTIN Take 1 capsule (300 mg total) by mouth 3 (three) times daily. What changed:  when to take this   hydrochlorothiazide 12.5 MG capsule Commonly known as:  MICROZIDE Take 12.5 mg by mouth daily.   HYDROcodone-acetaminophen 7.5-325 MG tablet Commonly known as:  NORCO Take 1-2 tablets by mouth every 4 (four) hours as needed for moderate pain.   isosorbide mononitrate 60 MG 24 hr tablet Commonly known as:  IMDUR Take 60 mg by mouth daily.   methocarbamol 750 MG tablet Commonly known as:  ROBAXIN Take 1 tablet (750 mg total) by mouth 4 (four) times daily.   montelukast 10 MG tablet Commonly known as:  SINGULAIR Take 10 mg by mouth at bedtime.   nitroGLYCERIN 0.4 MG SL tablet Commonly known as:  NITROSTAT Place 0.4 mg under the tongue every 5 (five) minutes as needed for chest pain.   rizatriptan 10 MG tablet Commonly known as:  MAXALT Take 10 mg by mouth daily as needed for migraine. May repeat in 2 hours if needed   SYSTANE ULTRA 0.4-0.3 % Soln Generic drug:  Polyethyl Glycol-Propyl Glycol Place 1-2 drops into both eyes 2 (two) times daily as needed (for dry eyes). Support Advanced Lubricant Eye Drops   tiZANidine 4 MG tablet Commonly known as:  ZANAFLEX Take 4 mg by mouth 2 (two) times daily.   topiramate 50 MG tablet Commonly known  as:  TOPAMAX Take 50-100 mg by mouth 2 (two) times daily. 50 mg in the morning and 100 mg at night   Vitamin D3 2000 units capsule Take 2,000 Units by mouth daily.

## 2016-02-07 NOTE — Progress Notes (Signed)
No acute events Doing well Home today

## 2016-02-07 NOTE — Progress Notes (Signed)
Pt and husband given D/C instructions with Rx's, verbal understanding was provided. Pt's JP drain was removed without difficulty. Pt's incision is covered with gauze dressing and is clean and dry. Pt's IV was removed prior to D/C. Pt D/C'd home via wheelchair @ 1200 per MD order. Pt is stable @ D/C and has no other needs at this time. Rema FendtAshley Shivaun Bilello, RN

## 2016-02-07 NOTE — Clinical Social Work Note (Signed)
CSW acknowledges SNF consult. Patient discharging home today.  CSW signing off. Consult again if any social work needs arise.  Charlynn CourtSarah Dashauna Heymann, CSW [phone]

## 2016-02-07 NOTE — Evaluation (Signed)
Physical Therapy Evaluation and Discharge  Patient Details Name: Kristina Riley MRN: [id number] DOB: 1950/06/22 Today's Date: 02/07/2016   History of Present Illness  65 yo female s/p ACDF C4-7   Clinical Impression  Patient evaluated by Physical Therapy with no further acute PT needs identified. All education has been completed and the patient has no further questions. At the time of PT eval pt was able to perform transfers and ambulation with modified independence to supervision for safety. Pt with mild balance deficits at baseline, and recommending outpatient PT to address this and prevent future falls. See below for any follow-up Physical Therapy or equipment needs. PT is signing off. Thank you for this referral.     Follow Up Recommendations Outpatient PT;Supervision for mobility/OOB    Equipment Recommendations  None recommended by PT    Recommendations for Other Services       Precautions / Restrictions Precautions Precautions: Cervical Precaution Comments: cervical handout provided and required reinforcement of no pulling/ pushing Required Braces or Orthoses: Cervical Brace Cervical Brace: Soft collar Restrictions Weight Bearing Restrictions: No      Mobility  Bed Mobility Overal bed mobility: Modified Independent             General bed mobility comments: Instructed in log roll technique.  Transfers Overall transfer level: Modified independent Equipment used: None             General transfer comment: able to power up without use of BIL UE  Ambulation/Gait Ambulation/Gait assistance: Supervision Ambulation Distance (Feet): 300 Feet Assistive device: None Gait Pattern/deviations: Step-through pattern;Decreased stride length Gait velocity: Decreased Gait velocity interpretation: Below normal speed for age/gender General Gait Details: Pt reports her strength and balance have been poor for several years, however walking well overall. No large LOB noted,  however pt appears to slightly stumble if turning too quickly.   Stairs Stairs: Yes Stairs assistance: Supervision Stair Management: One rail Right;Alternating pattern;Forwards Number of Stairs: 10 General stair comments: Pt completed without assistance.   Wheelchair Mobility    Modified Rankin (Stroke Patients Only)       Balance Overall balance assessment: Needs assistance Sitting-balance support: Feet supported;No upper extremity supported Sitting balance-Leahy Scale: Good     Standing balance support: No upper extremity supported;During functional activity Standing balance-Leahy Scale: Fair                               Pertinent Vitals/Pain Pain Assessment: Faces Faces Pain Scale: Hurts a little bit Pain Location: Incision site Pain Descriptors / Indicators: Operative site guarding Pain Intervention(s): Limited activity within patient's tolerance;Monitored during session;Repositioned    Home Living Family/patient expects to be discharged to:: Private residence Living Arrangements: Spouse/significant other Available Help at Discharge: Family Type of Home: House Home Access: Stairs to enter   Secretary/administratorntrance Stairs-Number of Steps: 3 Home Layout: One level Home Equipment: Bedside commode      Prior Function Level of Independence: Independent               Hand Dominance   Dominant Hand: Right    Extremity/Trunk Assessment   Upper Extremity Assessment: LUE deficits/detail       LUE Deficits / Details: reports none at this time but states "when i have over done it last night i had some tingling"   Lower Extremity Assessment: Overall WFL for tasks assessed (Pt reports weakness for years)      Cervical / Trunk  Assessment: Other exceptions (s/p surg)  Communication   Communication: No difficulties  Cognition Arousal/Alertness: Awake/alert Behavior During Therapy: WFL for tasks assessed/performed Overall Cognitive Status: Within  Functional Limits for tasks assessed                      General Comments General comments (skin integrity, edema, etc.): dressing dry and drain still intact    Exercises     Assessment/Plan    PT Assessment Patent does not need any further PT services  PT Problem List            PT Treatment Interventions      PT Goals (Current goals can be found in the Care Plan section)  Acute Rehab PT Goals Patient Stated Goal: Home today PT Goal Formulation: All assessment and education complete, DC therapy    Frequency     Barriers to discharge        Co-evaluation               End of Session Equipment Utilized During Treatment: Gait belt;Cervical collar Activity Tolerance: Patient tolerated treatment well Patient left: with call bell/phone within reach;in bed;with family/visitor present Nurse Communication: Mobility status         Time: [id number] PT Time Calculation (min) (ACUTE ONLY): 30 min   Charges:   PT Evaluation $PT Eval Moderate Complexity: 1 Procedure PT Treatments $Gait Training: 8-22 mins   PT G Codes:        Marylynn PearsonLaura D Aylssa Herrig 02/07/2016, 10:45 AM   Conni SlipperLaura Adeana Grilliot, PT, DPT Acute Rehabilitation Services Pager: [phone]

## 2016-02-07 NOTE — Evaluation (Signed)
Occupational Therapy Evaluation Patient Details Name: Kristina Riley MRN: [id number] DOB: Dec 04, 1950 Today's Date: 02/07/2016    History of Present Illness 65 yo female s/p ACDF C4-7   Past Medical History:  Diagnosis Date  . Anginal pain (HCC)   . Coronary artery disease   . Creatinine elevation   . Depression   . GERD (gastroesophageal reflux disease)   . Headache   . Hypertension   . Myocardial infarction       Clinical Impression   Patient is s/p ACDF C4-7 surgery resulting in functional limitations due to the deficits listed below (see OT problem list). PTA was independent with all adls. Pt very concerned with being able to carry a brief case full of paperwork. Pt advised not to pick up bag or pull it at this time. Pt advised to have spouse (A) with lifting and carrying of paper work. pt frowning in response.  Patient will benefit from skilled OT acutely to increase independence and safety with ADLS to allow discharge home without follow up.     Follow Up Recommendations  No OT follow up    Equipment Recommendations  None recommended by OT    Recommendations for Other Services       Precautions / Restrictions Precautions Precautions: Cervical Precaution Comments: cervical handout provided and required reinforcement of no pulling/ pushing Required Braces or Orthoses: Cervical Brace      Mobility Bed Mobility Overal bed mobility: Modified Independent                Transfers Overall transfer level: Modified independent               General transfer comment: able to power up without use of BIL UE    Balance                                            ADL Overall ADL's : Modified independent                                       General ADL Comments: educated on precautions with adls. pt able to don doff brace using mirror. pt needs cues for pushign / pulling throughout session     Vision Vision  Assessment?: No apparent visual deficits   Perception     Praxis      Pertinent Vitals/Pain Pain Assessment: Faces Faces Pain Scale: Hurts a little bit Pain Location: soreness throat Pain Descriptors / Indicators: Sore Pain Intervention(s): Monitored during session;Repositioned;Premedicated before session;Limited activity within patient's tolerance     Hand Dominance Right   Extremity/Trunk Assessment Upper Extremity Assessment Upper Extremity Assessment: LUE deficits/detail LUE Deficits / Details: reports none at this time but states "when i have over done it last night i had some tingling"   Lower Extremity Assessment Lower Extremity Assessment: Defer to PT evaluation   Cervical / Trunk Assessment Cervical / Trunk Assessment: Other exceptions (s/p surg)   Communication Communication Communication: No difficulties   Cognition Arousal/Alertness: Awake/alert Behavior During Therapy: WFL for tasks assessed/performed Overall Cognitive Status: Within Functional Limits for tasks assessed                     General Comments       Exercises  Shoulder Instructions      Home Living Family/patient expects to be discharged to:: Private residence Living Arrangements: Spouse/significant other Available Help at Discharge: Family Type of Home: House             Bathroom Shower/Tub: Walk-in Soil scientistshower   Bathroom Toilet: Standard     Home Equipment: Bedside commode          Prior Functioning/Environment Level of Independence: Independent                 OT Problem List:     OT Treatment/Interventions:      OT Goals(Current goals can be found in the care plan section)    OT Frequency:     Barriers to D/C:            Co-evaluation              End of Session Equipment Utilized During Treatment: Cervical collar Nurse Communication: Mobility status;Precautions  Activity Tolerance: Patient tolerated treatment well Patient left: in  bed;with call bell/phone within reach;with family/visitor present   Time: 0825-0853 OT Time Calculation (min): 28 min Charges:  OT General Charges $OT Visit: 1 Procedure OT Evaluation $OT Eval Moderate Complexity: 1 Procedure OT Treatments $Self Care/Home Management : 8-22 mins G-Codes:    Kristina Riley, Kristina Riley   Kristina Riley   OTR/L Pager: [phone]: [phone] Office: [facility] .

## 2016-02-10 ENCOUNTER — Encounter (HOSPITAL_COMMUNITY): Payer: Self-pay | Admitting: Neurological Surgery

## 2017-06-19 IMAGING — RF DG CERVICAL SPINE 1V
1 series · 1 of 1 positions shown · non-contrast
Comparison: MR of the cervical spine December 19, 2015

CLINICAL DATA: C4-C7 anterior cervical decompression, discs ectomy,
and fusion. Reported fluoro time is 10 seconds.

EXAM:
CERVICAL SPINE 1 VIEW

[Series 1: run · 1 of 1 slices shown]
[im 1/1]
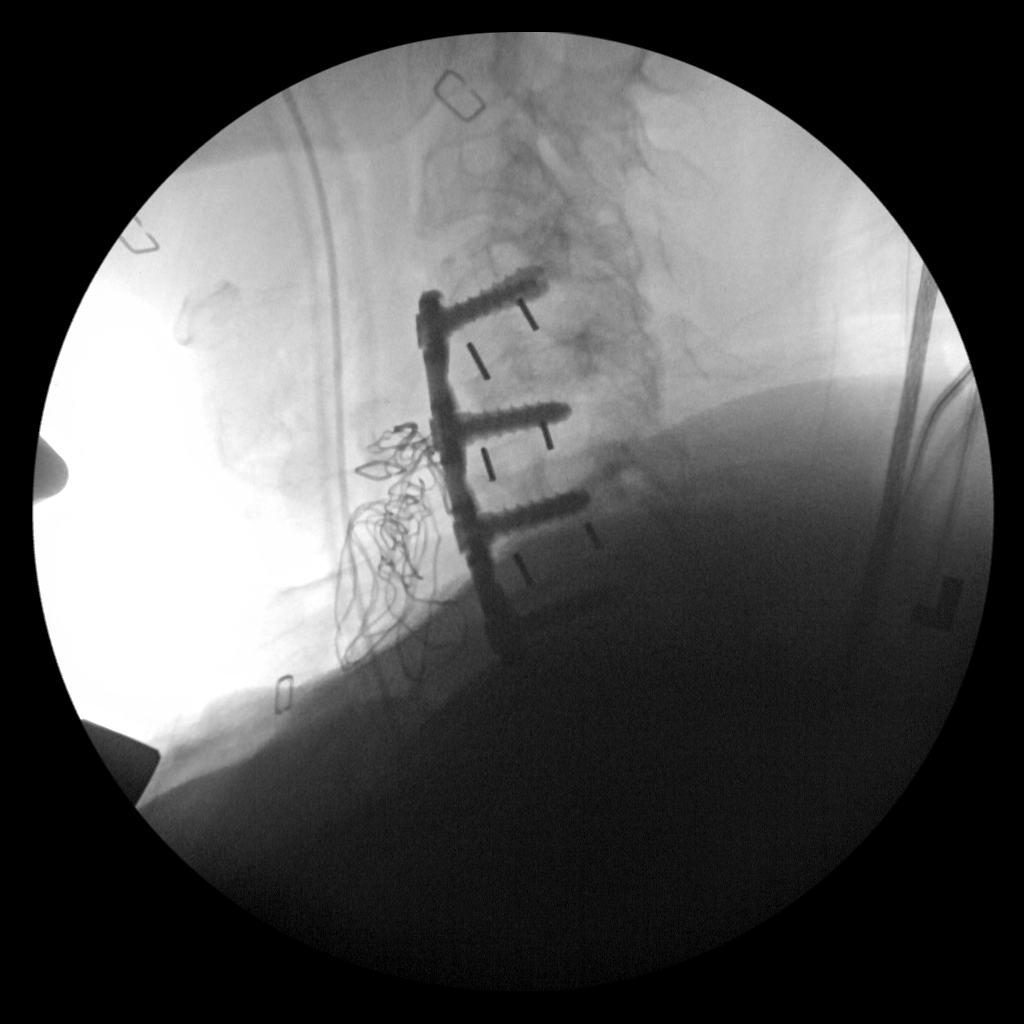

[1 of 1 positions shown; findings below may reference images not displayed]

FINDINGS: A single fluorospot radiograph is submitted. The odontoid is not
visible. The positioning of the cortical screws and anterior fusion
plate is presumed to be at C4 through C7. The intradiscal device
markers are noted at C4-5, C5-6, and C6-7. A surgical sponge is
present in the surgical wound.
IMPRESSION: The patient is undergone ACDF at presumably the C4 through C7
levels. A surgical sponge is present in the wound. No immediate
complication is observed.
